# Patient Record
Sex: Female | Born: 1965 | Race: White | Hispanic: No | Marital: Married | Smoking: Never smoker
Health system: Southern US, Community
[De-identification: ages and names within clinical notes are randomized; demographics above are authoritative.]

## PROBLEM LIST (undated history)

## (undated) DIAGNOSIS — J302 Other seasonal allergic rhinitis: Secondary | ICD-10-CM

## (undated) DIAGNOSIS — D279 Benign neoplasm of unspecified ovary: Secondary | ICD-10-CM

---

## 1999-09-18 HISTORY — PX: LAPAROSCOPIC CHOLECYSTECTOMY: SUR755

## 2013-09-17 HISTORY — PX: HEMORRHOID SURGERY: SHX153

## 2014-12-06 ENCOUNTER — Other Ambulatory Visit (HOSPITAL_COMMUNITY)
Admission: RE | Admit: 2014-12-06 | Discharge: 2014-12-06 | Disposition: A | Discharge status: Home / Self Care | Payer: Managed Care, Other (non HMO) | Source: Ambulatory Visit | Attending: Family Medicine | Admitting: Family Medicine

## 2014-12-06 DIAGNOSIS — Z124 Encounter for screening for malignant neoplasm of cervix: Secondary | ICD-10-CM | POA: Diagnosis not present

## 2014-12-06 DIAGNOSIS — Z1151 Encounter for screening for human papillomavirus (HPV): Secondary | ICD-10-CM | POA: Diagnosis present

## 2015-03-29 ENCOUNTER — Other Ambulatory Visit: Payer: Self-pay

## 2015-03-29 DIAGNOSIS — Z1231 Encounter for screening mammogram for malignant neoplasm of breast: Secondary | ICD-10-CM

## 2015-05-04 ENCOUNTER — Ambulatory Visit: Payer: Managed Care, Other (non HMO)

## 2015-05-12 ENCOUNTER — Ambulatory Visit: Payer: Managed Care, Other (non HMO)

## 2015-07-04 ENCOUNTER — Ambulatory Visit
Admission: RE | Admit: 2015-07-04 | Discharge: 2015-07-04 | Disposition: A | Discharge status: Home / Self Care | Payer: Managed Care, Other (non HMO) | Source: Ambulatory Visit

## 2015-07-04 DIAGNOSIS — Z1231 Encounter for screening mammogram for malignant neoplasm of breast: Secondary | ICD-10-CM

## 2015-07-18 ENCOUNTER — Ambulatory Visit: Payer: Managed Care, Other (non HMO)

## 2016-04-20 NOTE — H&P (Signed)
  Patient name Madison Whitaker, Madison Whitaker DICTATION# [id number] CSN# [id number]  Darlyn Chamber, MD 04/20/2016 8:12 AM

## 2016-04-20 NOTE — H&P (Signed)
[name], [name] NO.:  [id number]  MEDICAL RECORD NO.:  [id number]  LOCATION:                                 FACILITY:  PHYSICIAN:  Darlyn Chamber, M.D.   DATE OF BIRTH:  Aug 21, 1966  DATE OF ADMISSION:  05/03/2016 DATE OF DISCHARGE:                             HISTORY & PHYSICAL   The date of her surgery is May 03, 2016.  This is going to be done at [facility] on [facility].  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  In relation to the present admission, the patient was initially seen back in January, complaining of some right lower quadrant discomfort and menstrual irregularity.  We did an ultrasound on her in March that revealed a 4.4 x 3.3 cm cystic mass with calcifications involving the left ovary.  A CA-125 was drawn.  It was negative.  It was felt to be a dermoid.  We __________.  We did a follow-up ultrasound in June, and the cystic area had enlarged down to 4.6 x 3.5 x 3.7.  Again, appearing as a dermoid tumor.  There was no free fluid.  Again, CA-125 was normal.  She is now going to receive a laparoscopic evaluation and possible ovarian cystectomy versus removing the left ovary.  ALLERGIES:  She is allergic to SULFA.  MEDICATIONS:  None.  PAST MEDICAL HISTORY:  Usual childhood diseases.  No significant sequelae.  PAST SURGICAL HISTORY:  She has had her gallbladder removed.  She has had a miscarriage and 2 vaginal deliveries.  SOCIAL HISTORY:  No tobacco and only occasional alcohol use.  FAMILY HISTORY:  Noncontributory.  REVIEW OF SYSTEMS:  Noncontributory.  PHYSICAL EXAMINATION:  VITAL SIGNS:  The patient is afebrile with stable vital signs. HEENT:  The patient is normocephalic.  Pupils are equal, round, and reactive to light and  accommodation.  Extraocular movements were intact. Sclerae and conjunctivae clear.  Oropharynx clear. NECK:  Without thyromegaly. BREASTS:  Not examined. LUNGS:  Clear. CARDIOVASCULAR:  Regular rhythm and rate without murmurs or gallops. There are no carotid or abdominal bruits. ABDOMEN:  Benign.  No mass, organomegaly, or tenderness. PELVIC:  Normal external genitalia.  Vaginal mucosa clear.  Cervix unremarkable.  Uterus normal size, shape, and contour.  Adnexa unremarkable.  Cannot really feel the cyst. EXTREMITIES:  Trace edema. NEUROLOGIC:  Grossly within normal limits.  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  PLAN:  The patient will undergo laparoscopic evaluation and possible removal of cyst or the ovary.  The nature of the procedure have been discussed.  The risks have been explained including risk of infection. Risk of hemorrhage that could require transfusion with the risk of AIDS or hepatitis.  Risk of injury to adjacent organs such as bladder, bowel, or ureters that could require further exploratory surgery.  Risk of deep venous thrombosis and pulmonary embolus.  The patient expressed understanding of potential risks and complications.     Darlyn Chamber,  M.D.   ______________________________ Darlyn Chamber, M.D.    JSM/MEDQ  D:  04/20/2016  T:  04/20/2016  Job:  [id number]

## 2016-04-26 ENCOUNTER — Encounter (HOSPITAL_BASED_OUTPATIENT_CLINIC_OR_DEPARTMENT_OTHER): Payer: Self-pay | Admitting: *Deleted

## 2016-04-27 ENCOUNTER — Encounter (HOSPITAL_BASED_OUTPATIENT_CLINIC_OR_DEPARTMENT_OTHER): Payer: Self-pay | Admitting: *Deleted

## 2016-04-27 NOTE — Progress Notes (Addendum)
NPO AFTER MN.  ARRIVE AT 0600.  GETTING LAB WORK DONE Tuesday 05-01-2016 (CBC, SERUM hCG).

## 2016-05-01 DIAGNOSIS — D271 Benign neoplasm of left ovary: Secondary | ICD-10-CM | POA: Diagnosis not present

## 2016-05-01 DIAGNOSIS — N83202 Unspecified ovarian cyst, left side: Secondary | ICD-10-CM | POA: Diagnosis present

## 2016-05-01 DIAGNOSIS — N8312 Corpus luteum cyst of left ovary: Secondary | ICD-10-CM | POA: Diagnosis not present

## 2016-05-01 DIAGNOSIS — Z79899 Other long term (current) drug therapy: Secondary | ICD-10-CM | POA: Diagnosis not present

## 2016-05-01 LAB — CBC
HEMATOCRIT: 39.6 % (ref 36.0–46.0)
HEMOGLOBIN: 13.5 g/dL (ref 12.0–15.0)
MCH: 32.2 pg (ref 26.0–34.0)
MCHC: 34.1 g/dL (ref 30.0–36.0)
MCV: 94.5 fL (ref 78.0–100.0)
PLATELETS: 254 10*3/uL (ref 150–400)
RBC: 4.19 MIL/uL (ref 3.87–5.11)
RDW: 12.7 % (ref 11.5–15.5)
WBC: 7.4 10*3/uL (ref 4.0–10.5)

## 2016-05-01 LAB — HCG, SERUM, QUALITATIVE: Preg, Serum: NEGATIVE

## 2016-05-03 ENCOUNTER — Encounter (HOSPITAL_BASED_OUTPATIENT_CLINIC_OR_DEPARTMENT_OTHER)
Admission: RE | Disposition: A | Discharge status: Home / Self Care | Payer: Self-pay | Source: Ambulatory Visit | Attending: Obstetrics and Gynecology

## 2016-05-03 ENCOUNTER — Ambulatory Visit (HOSPITAL_BASED_OUTPATIENT_CLINIC_OR_DEPARTMENT_OTHER): Payer: Managed Care, Other (non HMO) | Admitting: Anesthesiology

## 2016-05-03 ENCOUNTER — Encounter (HOSPITAL_BASED_OUTPATIENT_CLINIC_OR_DEPARTMENT_OTHER): Payer: Self-pay | Admitting: *Deleted

## 2016-05-03 ENCOUNTER — Ambulatory Visit (HOSPITAL_BASED_OUTPATIENT_CLINIC_OR_DEPARTMENT_OTHER)
Admission: RE | Admit: 2016-05-03 | Discharge: 2016-05-03 | Disposition: A | Discharge status: Home / Self Care | Payer: Managed Care, Other (non HMO) | Source: Ambulatory Visit | Attending: Obstetrics and Gynecology | Admitting: Obstetrics and Gynecology

## 2016-05-03 DIAGNOSIS — N8312 Corpus luteum cyst of left ovary: Secondary | ICD-10-CM | POA: Insufficient documentation

## 2016-05-03 DIAGNOSIS — D271 Benign neoplasm of left ovary: Secondary | ICD-10-CM | POA: Insufficient documentation

## 2016-05-03 DIAGNOSIS — Z79899 Other long term (current) drug therapy: Secondary | ICD-10-CM | POA: Insufficient documentation

## 2016-05-03 DIAGNOSIS — N83202 Unspecified ovarian cyst, left side: Secondary | ICD-10-CM

## 2016-05-03 HISTORY — DX: Other seasonal allergic rhinitis: J30.2

## 2016-05-03 HISTORY — DX: Benign neoplasm of unspecified ovary: D27.9

## 2016-05-03 HISTORY — PX: LAPAROSCOPY: SHX197

## 2016-05-03 LAB — TYPE AND SCREEN
ABO/RH(D): O NEG
ANTIBODY SCREEN: NEGATIVE

## 2016-05-03 LAB — ABO/RH: ABO/RH(D): O NEG

## 2016-05-03 SURGERY — LAPAROSCOPY, DIAGNOSTIC
Anesthesia: General | Site: Pelvis | Laterality: Left

## 2016-05-03 MED ORDER — FENTANYL CITRATE (PF) 100 MCG/2ML IJ SOLN
INTRAMUSCULAR | Status: AC
  Filled 2016-05-03: qty 2

## 2016-05-03 MED ORDER — STERILE WATER FOR IRRIGATION IR SOLN
Status: DC | PRN
  Administered 2016-05-03: 3000 mL

## 2016-05-03 MED ORDER — OXYCODONE HCL 5 MG PO TABS
5.0000 mg | ORAL_TABLET | Freq: Once | ORAL | Status: DC | PRN
  Filled 2016-05-03: qty 1

## 2016-05-03 MED ORDER — SUGAMMADEX SODIUM 200 MG/2ML IV SOLN
INTRAVENOUS | Status: DC | PRN
  Administered 2016-05-03: 200 mg via INTRAVENOUS

## 2016-05-03 MED ORDER — LIDOCAINE HCL (CARDIAC) 20 MG/ML IV SOLN
INTRAVENOUS | Status: AC
  Filled 2016-05-03: qty 5

## 2016-05-03 MED ORDER — FLUORESCEIN SODIUM 10 % IV SOLN
INTRAVENOUS | Status: AC
  Filled 2016-05-03: qty 5

## 2016-05-03 MED ORDER — FLUORESCEIN SODIUM 10 % IV SOLN
INTRAVENOUS | Status: DC | PRN
  Administered 2016-05-03: 300 mg via INTRAVENOUS

## 2016-05-03 MED ORDER — DEXAMETHASONE SODIUM PHOSPHATE 10 MG/ML IJ SOLN
INTRAMUSCULAR | Status: AC
  Filled 2016-05-03: qty 1

## 2016-05-03 MED ORDER — ROCURONIUM BROMIDE 100 MG/10ML IV SOLN
INTRAVENOUS | Status: DC | PRN
  Administered 2016-05-03: 50 mg via INTRAVENOUS

## 2016-05-03 MED ORDER — ONDANSETRON HCL 4 MG/2ML IJ SOLN
4.0000 mg | Freq: Once | INTRAMUSCULAR | Status: DC | PRN
  Filled 2016-05-03: qty 2

## 2016-05-03 MED ORDER — ONDANSETRON HCL 4 MG/2ML IJ SOLN
INTRAMUSCULAR | Status: DC | PRN
  Administered 2016-05-03: 4 mg via INTRAVENOUS

## 2016-05-03 MED ORDER — FENTANYL CITRATE (PF) 100 MCG/2ML IJ SOLN
INTRAMUSCULAR | Status: DC | PRN
  Administered 2016-05-03 (×3): 50 ug via INTRAVENOUS

## 2016-05-03 MED ORDER — OXYCODONE-ACETAMINOPHEN 7.5-325 MG PO TABS: 1.0000 | ORAL_TABLET | ORAL | 0 refills | Status: AC | PRN

## 2016-05-03 MED ORDER — LIDOCAINE HCL (CARDIAC) 20 MG/ML IV SOLN
INTRAVENOUS | Status: DC | PRN
  Administered 2016-05-03: 80 mg via INTRAVENOUS

## 2016-05-03 MED ORDER — CIPROFLOXACIN IN D5W 400 MG/200ML IV SOLN
400.0000 mg | Freq: Once | INTRAVENOUS | Status: AC
  Administered 2016-05-03: 400 mg via INTRAVENOUS
  Filled 2016-05-03: qty 200

## 2016-05-03 MED ORDER — MEPERIDINE HCL 25 MG/ML IJ SOLN
6.2500 mg | INTRAMUSCULAR | Status: DC | PRN
Start: 2016-05-03 — End: 2016-05-03
  Filled 2016-05-03: qty 1

## 2016-05-03 MED ORDER — OXYCODONE HCL 5 MG/5ML PO SOLN
5.0000 mg | Freq: Once | ORAL | Status: DC | PRN
  Filled 2016-05-03: qty 5

## 2016-05-03 MED ORDER — SODIUM CHLORIDE 0.9 % IR SOLN: Status: DC | PRN

## 2016-05-03 MED ORDER — SODIUM CHLORIDE 0.9 % IJ SOLN
INTRAMUSCULAR | Status: AC
  Filled 2016-05-03: qty 10

## 2016-05-03 MED ORDER — HYDROMORPHONE HCL 1 MG/ML IJ SOLN
0.2500 mg | INTRAMUSCULAR | Status: DC | PRN
  Filled 2016-05-03: qty 1

## 2016-05-03 MED ORDER — SODIUM CHLORIDE 0.9 % IR SOLN
Status: DC | PRN
  Administered 2016-05-03: 500 mL via INTRAVESICAL

## 2016-05-03 MED ORDER — MIDAZOLAM HCL 5 MG/5ML IJ SOLN
INTRAMUSCULAR | Status: DC | PRN
  Administered 2016-05-03: 2 mg via INTRAVENOUS

## 2016-05-03 MED ORDER — LACTATED RINGERS IR SOLN
Status: DC | PRN
  Administered 2016-05-03: 3000 mL

## 2016-05-03 MED ORDER — MIDAZOLAM HCL 2 MG/2ML IJ SOLN
INTRAMUSCULAR | Status: AC
  Filled 2016-05-03: qty 2

## 2016-05-03 MED ORDER — BUPIVACAINE HCL 0.25 % IJ SOLN
INTRAMUSCULAR | Status: DC | PRN
  Administered 2016-05-03: 8 mL

## 2016-05-03 MED ORDER — DEXAMETHASONE SODIUM PHOSPHATE 4 MG/ML IJ SOLN
INTRAMUSCULAR | Status: DC | PRN
  Administered 2016-05-03: 10 mg via INTRAVENOUS

## 2016-05-03 MED ORDER — CIPROFLOXACIN IN D5W 400 MG/200ML IV SOLN
INTRAVENOUS | Status: AC
  Filled 2016-05-03: qty 200

## 2016-05-03 MED ORDER — PROPOFOL 10 MG/ML IV BOLUS
INTRAVENOUS | Status: DC | PRN
  Administered 2016-05-03: 200 mg via INTRAVENOUS

## 2016-05-03 MED ORDER — ARTIFICIAL TEARS OP OINT
TOPICAL_OINTMENT | OPHTHALMIC | Status: AC
  Filled 2016-05-03: qty 3.5

## 2016-05-03 MED ORDER — ROCURONIUM BROMIDE 100 MG/10ML IV SOLN
INTRAVENOUS | Status: AC
  Filled 2016-05-03: qty 1

## 2016-05-03 MED ORDER — ONDANSETRON HCL 4 MG/2ML IJ SOLN
INTRAMUSCULAR | Status: AC
  Filled 2016-05-03: qty 2

## 2016-05-03 MED ORDER — LACTATED RINGERS IV SOLN
INTRAVENOUS | Status: DC
  Administered 2016-05-03 (×2): via INTRAVENOUS
  Filled 2016-05-03: qty 1000

## 2016-05-03 MED ORDER — CEFAZOLIN SODIUM-DEXTROSE 2-4 GM/100ML-% IV SOLN
2.0000 g | INTRAVENOUS | Status: DC
  Filled 2016-05-03: qty 100

## 2016-05-03 SURGICAL SUPPLY — 47 items
APPLICATOR COTTON TIP 6IN STRL (MISCELLANEOUS) ×3 IMPLANT
BAG RETRIEVAL 10 (BASKET) ×1
BAG RETRIEVAL 10MM (BASKET) ×1
BLADE SURG 11 STRL SS (BLADE) ×3 IMPLANT
CANISTER SUCTION 1200CC (MISCELLANEOUS) ×3 IMPLANT
CANISTER SUCTION 2500CC (MISCELLANEOUS) ×3 IMPLANT
CATH ROBINSON RED A/P 16FR (CATHETERS) ×3 IMPLANT
COVER MAYO STAND STRL (DRAPES) ×3 IMPLANT
DRAPE UNDERBUTTOCKS STRL (DRAPE) ×3 IMPLANT
ELECT REM PT RETURN 9FT ADLT (ELECTROSURGICAL) ×3
ELECTRODE REM PT RTRN 9FT ADLT (ELECTROSURGICAL) ×1 IMPLANT
GLOVE BIO SURGEON STRL SZ7 (GLOVE) ×6 IMPLANT
GLOVE ECLIPSE 7.0 STRL STRAW (GLOVE) ×3 IMPLANT
GLOVE INDICATOR 7.0 STRL GRN (GLOVE) ×3 IMPLANT
GLOVE INDICATOR 7.5 STRL GRN (GLOVE) ×12 IMPLANT
GOWN STRL REUS W/TWL LRG LVL3 (GOWN DISPOSABLE) ×6 IMPLANT
GOWN STRL REUS W/TWL XL LVL3 (GOWN DISPOSABLE) ×6 IMPLANT
KIT ROOM TURNOVER WOR (KITS) ×3 IMPLANT
LEGGING LITHOTOMY PAIR STRL (DRAPES) ×3 IMPLANT
LIQUID BAND (GAUZE/BANDAGES/DRESSINGS) ×3 IMPLANT
NEEDLE HYPO 25X1 1.5 SAFETY (NEEDLE) ×3 IMPLANT
NEEDLE INSUFFLATION 14GA 120MM (NEEDLE) ×3 IMPLANT
NS IRRIG 500ML POUR BTL (IV SOLUTION) ×3 IMPLANT
PACK BASIN DAY SURGERY FS (CUSTOM PROCEDURE TRAY) ×3 IMPLANT
PACK LAPAROSCOPY II (CUSTOM PROCEDURE TRAY) ×3 IMPLANT
PAD OB MATERNITY 4.3X12.25 (PERSONAL CARE ITEMS) ×3 IMPLANT
PAD PREP 24X48 CUFFED NSTRL (MISCELLANEOUS) ×3 IMPLANT
POUCH SPECIMEN RETRIEVAL 10MM (ENDOMECHANICALS) IMPLANT
SEALER TISSUE G2 CVD JAW 45CM (ENDOMECHANICALS) ×3 IMPLANT
SET IRRIG TUBING LAPAROSCOPIC (IRRIGATION / IRRIGATOR) ×3 IMPLANT
SET IRRIG Y TYPE TUR BLADDER L (SET/KITS/TRAYS/PACK) ×3 IMPLANT
SOLUTION ANTI FOG 6CC (MISCELLANEOUS) ×3 IMPLANT
SUT VIC AB 3-0 PS2 18 (SUTURE) ×4
SUT VIC AB 3-0 PS2 18XBRD (SUTURE) ×2 IMPLANT
SUT VICRYL 0 UR6 27IN ABS (SUTURE) ×3 IMPLANT
SYR CONTROL 10ML LL (SYRINGE) ×3 IMPLANT
SYRINGE 10CC LL (SYRINGE) ×3 IMPLANT
SYS BAG RETRIEVAL 10MM (BASKET) ×1
SYSTEM BAG RETRIEVAL 10MM (BASKET) ×1 IMPLANT
TOWEL OR 17X24 6PK STRL BLUE (TOWEL DISPOSABLE) ×6 IMPLANT
TRAY DSU PREP LF (CUSTOM PROCEDURE TRAY) ×3 IMPLANT
TROCAR OPTI TIP 5M 100M (ENDOMECHANICALS) ×3 IMPLANT
TROCAR XCEL DIL TIP R 11M (ENDOMECHANICALS) ×3 IMPLANT
TUBING INSUFFLATION 10FT LAP (TUBING) ×3 IMPLANT
WARMER LAPAROSCOPE (MISCELLANEOUS) ×3 IMPLANT
WATER STERILE IRR 3000ML UROMA (IV SOLUTION) ×3 IMPLANT
WATER STERILE IRR 500ML POUR (IV SOLUTION) ×3 IMPLANT

## 2016-05-03 NOTE — Brief Op Note (Signed)
05/03/2016  8:38 AM  PATIENT:  Maud Deed  50 y.o. female  PRE-OPERATIVE DIAGNOSIS:  dermoid of ovary  POST-OPERATIVE DIAGNOSIS:  dermoid of ovary  PROCEDURE:  Procedure(s): LAPAROSCOPY DIAGNOSTIC, ovarian cystectomy,  removal of left tube and ovary, cystoscopy (Left)  SURGEON:  Surgeon(s) and Role:    * Arvella Nigh, MD - Primary  PHYSICIAN ASSISTANT:   ASSISTANTS: none   ANESTHESIA:   local and general  EBL:  Total I/O In: 900 [I.V.:900] Out: 50 [Urine:50]  BLOOD ADMINISTERED:none  DRAINS: none   LOCAL MEDICATIONS USED:  MARCAINE     SPECIMEN:  Source of Specimen:  left tube and ovary  washings  DISPOSITION OF SPECIMEN:  PATHOLOGY  COUNTS:  YES  TOURNIQUET:  * No tourniquets in log *  DICTATION: .Other Dictation: Dictation Number [id number]  PLAN OF CARE: Discharge to home after PACU  PATIENT DISPOSITION:  PACU - hemodynamically stable.   Delay start of Pharmacological VTE agent (>24hrs) due to surgical blood loss or risk of bleeding: not applicable

## 2016-05-03 NOTE — Anesthesia Procedure Notes (Signed)
Procedure Name: Intubation Date/Time: 05/03/2016 7:35 AM Performed by: Wanita Chamberlain Pre-anesthesia Checklist: Patient identified, Timeout performed, Emergency Drugs available, Suction available and Patient being monitored Patient Re-evaluated:Patient Re-evaluated prior to inductionOxygen Delivery Method: Circle system utilized Preoxygenation: Pre-oxygenation with 100% oxygen Intubation Type: IV induction Ventilation: Mask ventilation without difficulty Laryngoscope Size: Mac and 3 Grade View: Grade I Tube type: Oral Tube size: 7.0 mm Number of attempts: 1 Airway Equipment and Method: Stylet Placement Confirmation: ETT inserted through vocal cords under direct vision,  positive ETCO2,  CO2 detector and breath sounds checked- equal and bilateral Secured at: 21 cm Tube secured with: Tape Dental Injury: Teeth and Oropharynx as per pre-operative assessment

## 2016-05-03 NOTE — Anesthesia Postprocedure Evaluation (Signed)
Anesthesia Post Note  Patient: Merelene Thunberg  Procedure(s) Performed: Procedure(s) (LRB): LAPAROSCOPY DIAGNOSTIC, ovarian cystectomy,  removal of left tube and ovary, cystoscopy (Left)  Patient location during evaluation: PACU Anesthesia Type: General Level of consciousness: awake and alert Pain management: pain level controlled Vital Signs Assessment: post-procedure vital signs reviewed and stable Respiratory status: spontaneous breathing, nonlabored ventilation, respiratory function stable and patient connected to nasal cannula oxygen Cardiovascular status: blood pressure returned to baseline and stable Postop Assessment: no signs of nausea or vomiting Anesthetic complications: no    Last Vitals:  Vitals:   05/03/16 0853 05/03/16 0915  BP: 110/67 103/74  Pulse: 72 (!) 50  Resp: (!) 9 11  Temp: 36.4 C     Last Pain:  Vitals:   05/03/16 0915  TempSrc:   PainSc: Asleep                 Gabryel Talamo A

## 2016-05-03 NOTE — H&P (Signed)
  History and physical exam unchanged 

## 2016-05-03 NOTE — Op Note (Signed)
NAMERYA, BISAILLON NO.:  [id number]  MEDICAL RECORD NO.:  [id number]  LOCATION:                                 FACILITY:  PHYSICIAN:  Darlyn Chamber, M.D.        DATE OF BIRTH:  DATE OF PROCEDURE:  05/03/2016 DATE OF DISCHARGE:                              OPERATIVE REPORT   PREOPERATIVE DIAGNOSIS:  Cystic enlargement of left ovary, apparent dermoid.  POSTOPERATIVE DIAGNOSIS:  Cystic enlargement of the left ovary.  OPERATIVE PROCEDURE:  Open laparoscopy with left salpingo-oophorectomy. Cystoscopy.  SURGEON:  Darlyn Chamber, M.D.  ANESTHESIA:  General endotracheal.  ESTIMATED BLOOD LOSS:  Minimal.  PACKS AND DRAINS:  None.  INTRAOPERATIVE BLOOD PLACED:  None.  COMPLICATIONS:  None.  INDICATION:  Dictated in the history and physical.  DESCRIPTION OF PROCEDURE:  The patient was taken to the OR and placed in supine position.  After satisfactory level of general endotracheal anesthesia obtained, the patient was placed in dorsal lithotomy position using the Allen stirrups.  The abdomen, perineum and vagina were prepped out with Betadine.  A Hulka tenaculum was put in place and secured. Bladder was emptied by in-and-out catheterization.  The patient was then draped in sterile field.  A subumbilical incision was made with a knife. Incision was extended through the subcutaneous tissue.  The fascia was identified and entered sharply.  The peritoneum was entered with blunt finger pressure.  The open laparoscopic trocar was put in place and secured.  The abdomen was inflated with carbon dioxide.  Laparoscope was introduced.  There was no evidence of injury to adjacent organs.  The upper abdomen and liver were clear.  Gallbladder was surgically absent. Appendix was normal.  She had cystic enlargement of the left ovary with a 6-7 cm clear cyst.  Did not look like a dermoid.  Right ovary was unremarkable.  A 5-mm trocar was put in place in the suprapubic  area. We then obtained washings.  Again, uterus was normal, right tube and ovary normal.  Left ovary was cystically enlarged.  We then switched the 5-mm trocar for a 10/11 and we put the 5-mm trocar in the left lower quadrant after visualization of the epigastric vessels.  The ovary and tube were elevated.  We could identify the ureter along the left pelvic sidewall.  Using the EnSeal, the ovarian vasculature was cauterized and incised.  The mesenteric attachments of the ovary and tube were cauterized, incised up to the uterus.  Then, the utero-ovarian pedicle was cauterized and incised.  We had some oozing.  This was brought under control with the bipolar.  At this point in time, an endobag was put through the suprapubic incision.  The tube and ovary were placed in an endobag and brought out through the suprapubic area.  We were able to pop the cyst, removed the fluid and the ovary was removed and sent off to Pathology.  We then replaced the 10/11 trocar.  At this point in time bringing the bipolar in, we made sure we had good hemostasis at the utero-ovarian pedicle as well as the ovarian vasculature.  We irrigated the pelvis and there was no  active bleeding noted.  No evidence of injury to adjacent organs.  We deflated the abdomen and reinflated, looked for any bleeding, there was none.  Then, at this point in time, we completely deflated the abdomen.  All trocars were removed. Subumbilical fascia was closed with a figure-of-eight of 0 Vicryl.  Skin was closed with interrupted subcuticular 4-0 Vicryl.  Suprapubic incision was closed with interrupted sutures of 3-0 Vicryl.  The left lower quadrant incision was closed with Dermabond.  Hulka tenaculum was then removed.  The patient was given fluorescein.  Cystoscopy was performed.  There was no evidence of injury to the bladder.  We could see green-tinged urine coming from the left ureter indicating that was intact.  Cystoscope was then  removed.  The bladder was emptied.  The patient was taken out of the dorsal lithotomy position.  Once alert and extubated, transferred to the recovery room in good condition.  Sponge, instrument and needle counts were reported as correct by circulating nurse x2.     Darlyn Chamber, M.D.     JSM/MEDQ  D:  05/03/2016  T:  05/03/2016  Job:  [id number]

## 2016-05-03 NOTE — Discharge Instructions (Signed)
Laparoscopy- Care After Refer to this sheet in the next few weeks. These instructions provide you with information about caring for yourself after your procedure. Your health care provider may also give you more specific instructions. Your treatment has been planned according to current medical practices, but problems sometimes occur. Call your health care provider if you have any problems or questions after your procedure. WHAT TO EXPECT AFTER THE PROCEDURE After your procedure, it is common to have:  Sore throat.  Soreness at the incision site.  Mild cramping.  Tiredness.  Mild nausea or vomiting.  Shoulder pain. HOME CARE INSTRUCTIONS  Rest for the remainder of the day.  Take medicines only as directed by your health care provider. These include over-the-counter medicines and prescription medicines. Do not take aspirin, which can cause bleeding.  Over the next few days, gradually return to your normal activities and your normal diet.  Avoid sexual intercourse for 2 weeks or as directed by your health care provider.  Do not use tampons, and do not douche.  Do not drive or operate heavy machinery while taking pain medicine.  Do not lift anything that is heavier than 5 lb (2.3 kg) for 2 weeks or as directed by your health care provider.  Do not take baths. Take showers only. Ask your health care provider when you can start taking baths.  Try to have help for your household needs for the first 7-10 days.  There are many different ways to close and cover an incision, including stitches (sutures), skin glue, and adhesive strips. Follow instructions from your health care provider about:  Incision care.  Bandage (dressing) changes and removal.  Incision closure removal.  Check your incision area every day for signs of infection. Watch for:  Redness, swelling, or pain.  Fluid, blood, or pus.  Keep all follow-up visits as directed by your health care provider. SEEK MEDICAL  CARE IF:  You have redness, swelling, or increasing pain in your incision area.  You have fluid, blood, or pus coming from your incision for longer than 1 day.  You notice a bad smell coming from your incision or your dressing.  The edges of your incision break open after the sutures have been removed.  Your pain does not decrease after 2-3 days.  You have a rash.  You repeatedly become dizzy or light-headed.  You have a reaction to your medicine.  Your pain medicine is not helping.  You are constipated. SEEK IMMEDIATE MEDICAL CARE IF:  You have a fever.  You faint.  You have increasing pain in your abdomen.  You have severe pain in one or both of your shoulders.  You have bleeding or drainage from your suture sites or your vagina after surgery.  You have shortness of breath or have difficulty breathing.  You have chest pain or leg pain.  You have ongoing nausea, vomiting, or diarrhea.   This information is not intended to replace advice given to you by your health care provider. Make sure you discuss any questions you have with your health care provider.   Document Released: 03/23/2005 Document Revised: 01/18/2015 Document Reviewed: 12/15/2011 Elsevier Interactive Patient Education 2016 [facility] Home Care Instructions  Activity: Get plenty of rest for the remainder of the day. A responsible adult should stay with you for 24 hours following the procedure.  For the next 24 hours, DO NOT: -Drive a car -Paediatric nurse -Drink alcoholic beverages -Take any medication unless instructed by your  physician -Make any legal decisions or sign important papers.  Meals: Start with liquid foods such as gelatin or soup. Progress to regular foods as tolerated. Avoid greasy, spicy, heavy foods. If nausea and/or vomiting occur, drink only clear liquids until the nausea and/or vomiting subsides. Call your physician if vomiting continues.  Special  Instructions/Symptoms: Your throat may feel dry or sore from the anesthesia or the breathing tube placed in your throat during surgery. If this causes discomfort, gargle with warm salt water. The discomfort should disappear within 24 hours.  If you had a scopolamine patch placed behind your ear for the management of post- operative nausea and/or vomiting:  1. The medication in the patch is effective for 72 hours, after which it should be removed.  Wrap patch in a tissue and discard in the trash. Wash hands thoroughly with soap and water. 2. You may remove the patch earlier than 72 hours if you experience unpleasant side effects which may include dry mouth, dizziness or visual disturbances. 3. Avoid touching the patch. Wash your hands with soap and water after contact with the patch.

## 2016-05-03 NOTE — Transfer of Care (Signed)
Immediate Anesthesia Transfer of Care Note  Patient: Madison Whitaker  Procedure(s) Performed: Procedure(s): LAPAROSCOPY DIAGNOSTIC, ovarian cystectomy,  removal of left tube and ovary, cystoscopy (Left)  Patient Location: PACU  Anesthesia Type:General  Level of Consciousness: awake, alert , oriented and patient cooperative  Airway & Oxygen Therapy: Patient Spontanous Breathing and Patient connected to nasal cannula oxygen  Post-op Assessment: Report given to RN and Post -op Vital signs reviewed and stable  Post vital signs: Reviewed and stable  Last Vitals:  Vitals:   05/03/16 0915 05/03/16 0930  BP: 103/74 100/70  Pulse: (!) 50 (!) 48  Resp: 11 11  Temp:      Last Pain:  Vitals:   05/03/16 0930  TempSrc:   PainSc: 4       Patients Stated Pain Goal: 6 ([id number])  Complications: No apparent anesthesia complications

## 2016-05-03 NOTE — Anesthesia Preprocedure Evaluation (Addendum)
Anesthesia Evaluation  Patient identified by MRN, date of birth, ID band Patient awake    Reviewed: Allergy & Precautions, NPO status , Patient's Chart, lab work & pertinent test results  Airway Mallampati: I  TM Distance: >3 FB Neck ROM: Full    Dental  (+) Teeth Intact, Dental Advisory Given   Pulmonary neg pulmonary ROS,    breath sounds clear to auscultation       Cardiovascular Exercise Tolerance: Good  Rhythm:Regular Rate:Normal     Neuro/Psych negative psych ROS   GI/Hepatic negative GI ROS, Neg liver ROS,   Endo/Other  negative endocrine ROS  Renal/GU negative Renal ROS  negative genitourinary   Musculoskeletal negative musculoskeletal ROS (+)   Abdominal   Peds  Hematology negative hematology ROS (+)   Anesthesia Other Findings   Reproductive/Obstetrics negative OB ROS                           Anesthesia Physical Anesthesia Plan  ASA: I  Anesthesia Plan: General   Post-op Pain Management:    Induction: Intravenous  Airway Management Planned: Oral ETT  Additional Equipment:   Intra-op Plan:   Post-operative Plan: Extubation in OR  Informed Consent: I have reviewed the patients History and Physical, chart, labs and discussed the procedure including the risks, benefits and alternatives for the proposed anesthesia with the patient or authorized representative who has indicated his/her understanding and acceptance.   Dental advisory given  Plan Discussed with: CRNA, Anesthesiologist and Surgeon  Anesthesia Plan Comments:         Anesthesia Quick Evaluation

## 2016-05-04 ENCOUNTER — Encounter (HOSPITAL_BASED_OUTPATIENT_CLINIC_OR_DEPARTMENT_OTHER): Payer: Self-pay | Admitting: Obstetrics and Gynecology

## 2016-06-15 ENCOUNTER — Other Ambulatory Visit: Payer: Self-pay | Admitting: Family Medicine

## 2016-06-15 DIAGNOSIS — Z1231 Encounter for screening mammogram for malignant neoplasm of breast: Secondary | ICD-10-CM

## 2016-07-05 ENCOUNTER — Ambulatory Visit
Admission: RE | Admit: 2016-07-05 | Discharge: 2016-07-05 | Disposition: A | Discharge status: Home / Self Care | Payer: Managed Care, Other (non HMO) | Source: Ambulatory Visit | Attending: Family Medicine | Admitting: Family Medicine

## 2016-07-05 DIAGNOSIS — Z1231 Encounter for screening mammogram for malignant neoplasm of breast: Secondary | ICD-10-CM

## 2018-06-27 ENCOUNTER — Other Ambulatory Visit: Payer: Self-pay | Admitting: Family Medicine

## 2018-06-27 DIAGNOSIS — Z1231 Encounter for screening mammogram for malignant neoplasm of breast: Secondary | ICD-10-CM

## 2018-07-08 ENCOUNTER — Ambulatory Visit
Admission: RE | Admit: 2018-07-08 | Discharge: 2018-07-08 | Disposition: A | Discharge status: Home / Self Care | Payer: Commercial Managed Care - PPO | Source: Ambulatory Visit | Attending: Family Medicine | Admitting: Family Medicine

## 2018-07-08 DIAGNOSIS — Z1231 Encounter for screening mammogram for malignant neoplasm of breast: Secondary | ICD-10-CM

## 2020-01-14 ENCOUNTER — Ambulatory Visit: Payer: Self-pay | Attending: Internal Medicine

## 2020-01-14 ENCOUNTER — Ambulatory Visit: Payer: Commercial Managed Care - PPO

## 2020-01-14 DIAGNOSIS — Z23 Encounter for immunization: Secondary | ICD-10-CM

## 2020-01-14 NOTE — Progress Notes (Signed)
   Covid-19 Vaccination Clinic  Name:  Madison Whitaker    MRN: [id number] DOB: [birth year]  01/14/2020  Madison Whitaker was observed post Covid-19 immunization for 15 minutes without incident. She was provided with Vaccine Information Sheet and instruction to access the V-Safe system.   Madison Whitaker was instructed to call 911 with any severe reactions post vaccine: Marland Kitchen Difficulty breathing  . Swelling of face and throat  . A fast heartbeat  . A bad rash all over body  . Dizziness and weakness   Immunizations Administered    Name Date Dose VIS Date Route   Pfizer COVID-19 Vaccine 01/14/2020  2:23 PM 0.3 mL 11/11/2018 Intramuscular   Manufacturer: [place]   Lot: [id number]   [place]: [id number]

## 2020-02-08 ENCOUNTER — Ambulatory Visit: Payer: Self-pay | Attending: Internal Medicine

## 2020-02-08 DIAGNOSIS — Z23 Encounter for immunization: Secondary | ICD-10-CM

## 2020-02-08 NOTE — Progress Notes (Signed)
   Covid-19 Vaccination Clinic  Name:  Madison Whitaker    MRN: [id number] DOB: [birth year]  02/08/2020  [name] was observed post Covid-19 immunization for 15 minutes without incident. She was provided with Vaccine Information Sheet and instruction to access the V-Safe system.   [name] was instructed to call 911 with any severe reactions post vaccine: Marland Kitchen Difficulty breathing  . Swelling of face and throat  . A fast heartbeat  . A bad rash all over body  . Dizziness and weakness   Immunizations Administered    Name Date Dose VIS Date Route   Pfizer COVID-19 Vaccine 02/08/2020 10:09 AM 0.3 mL 11/11/2018 Intramuscular   Manufacturer: [employer]   Lot: [id number]   [place]: [id number]

## 2020-09-28 IMAGING — MG DIGITAL SCREENING BILATERAL MAMMOGRAM WITH CAD
4 series · 4 of 4 positions shown · non-contrast
Comparison: Previous exam(s).

CLINICAL DATA: Screening.

EXAM:
DIGITAL SCREENING BILATERAL MAMMOGRAM WITH CAD

[R CC]
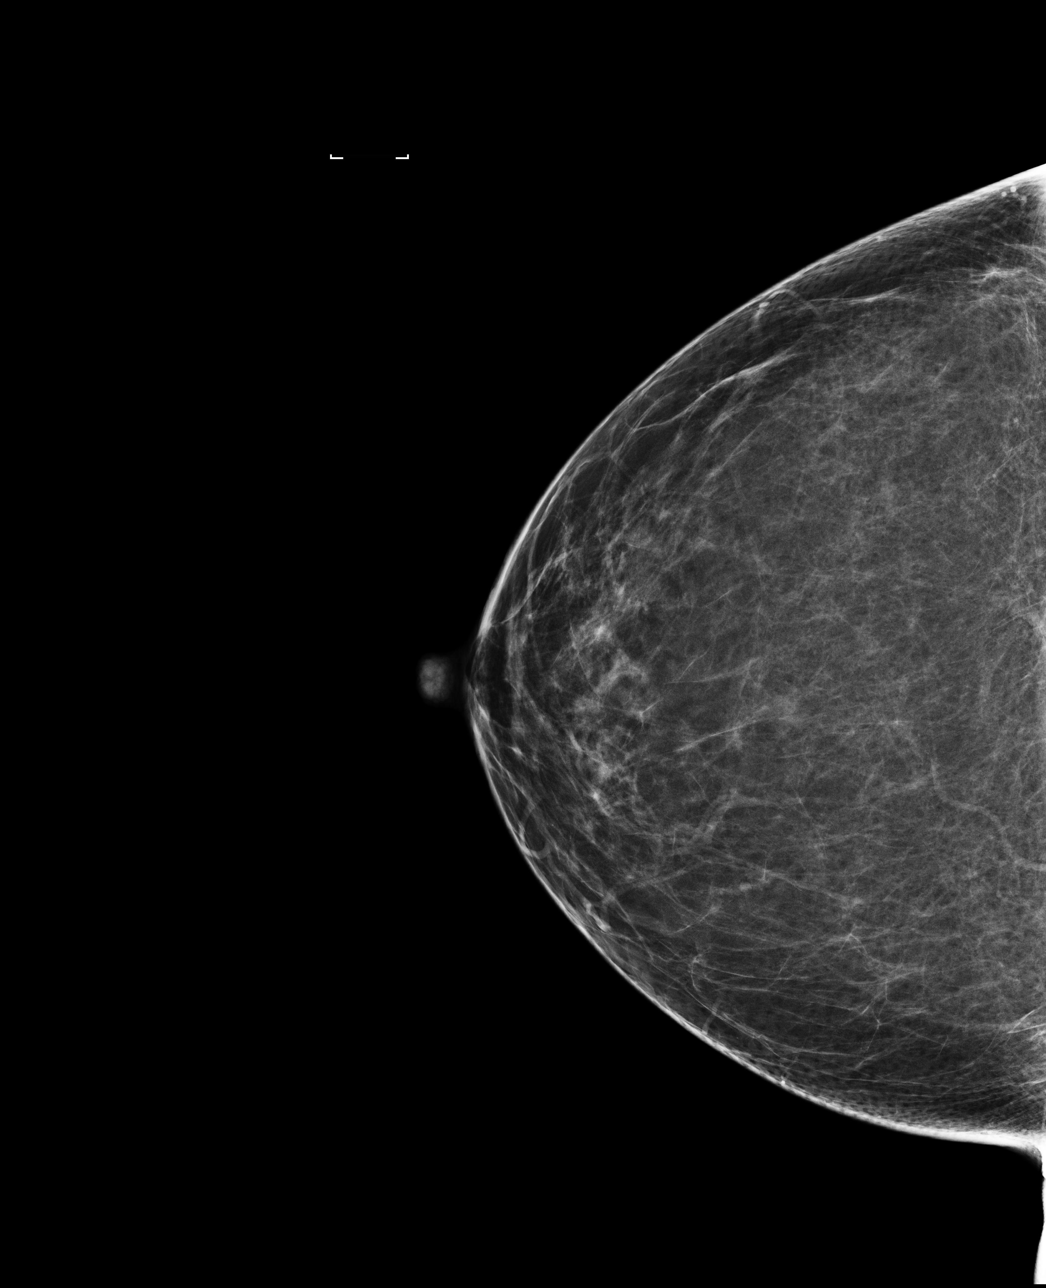

[R MLO]
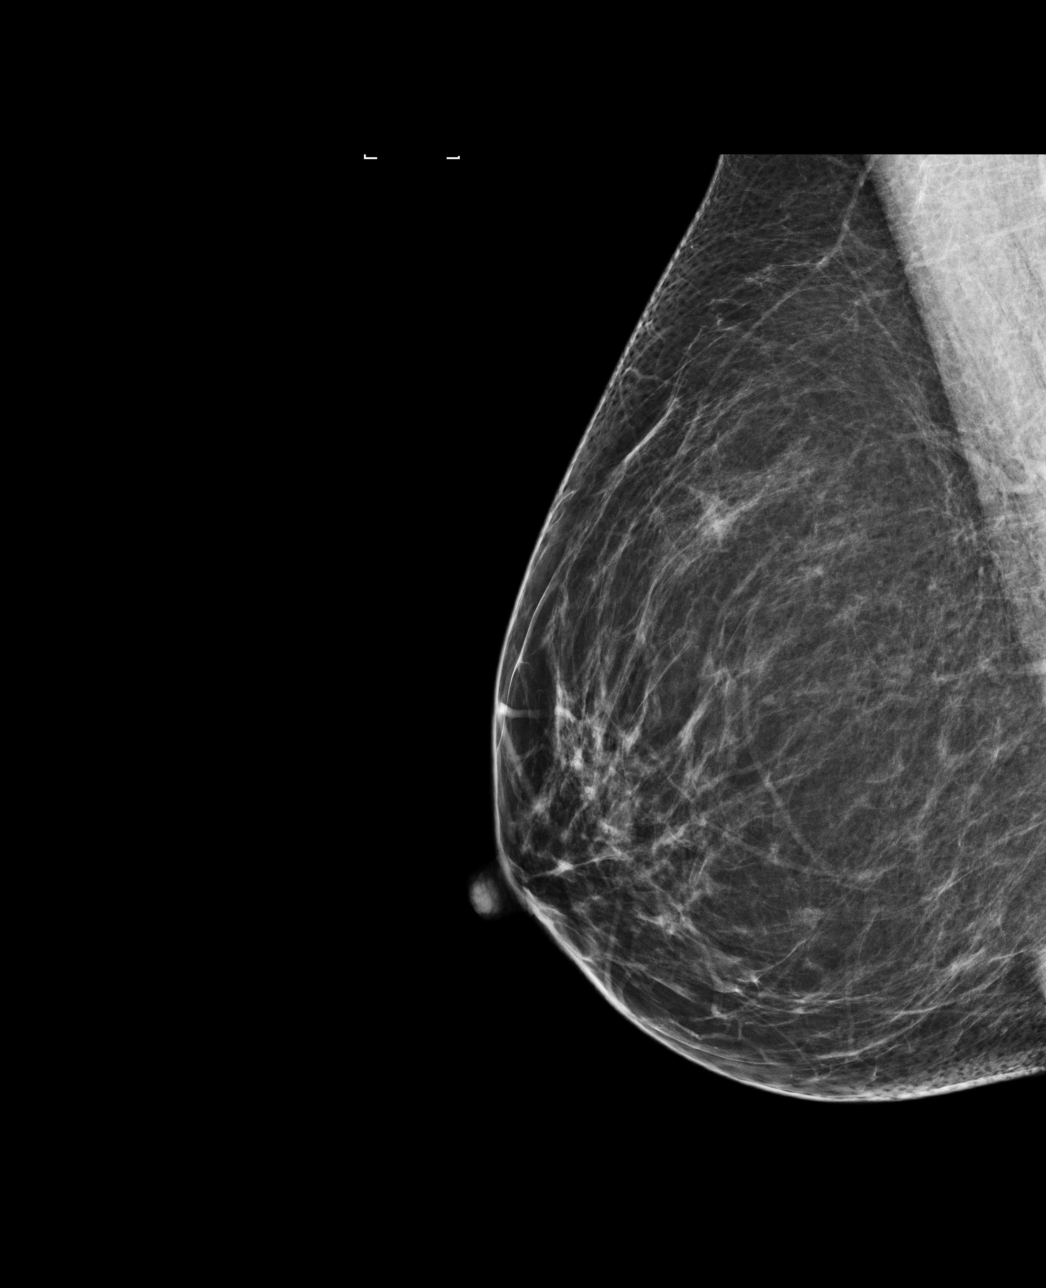

[L MLO]
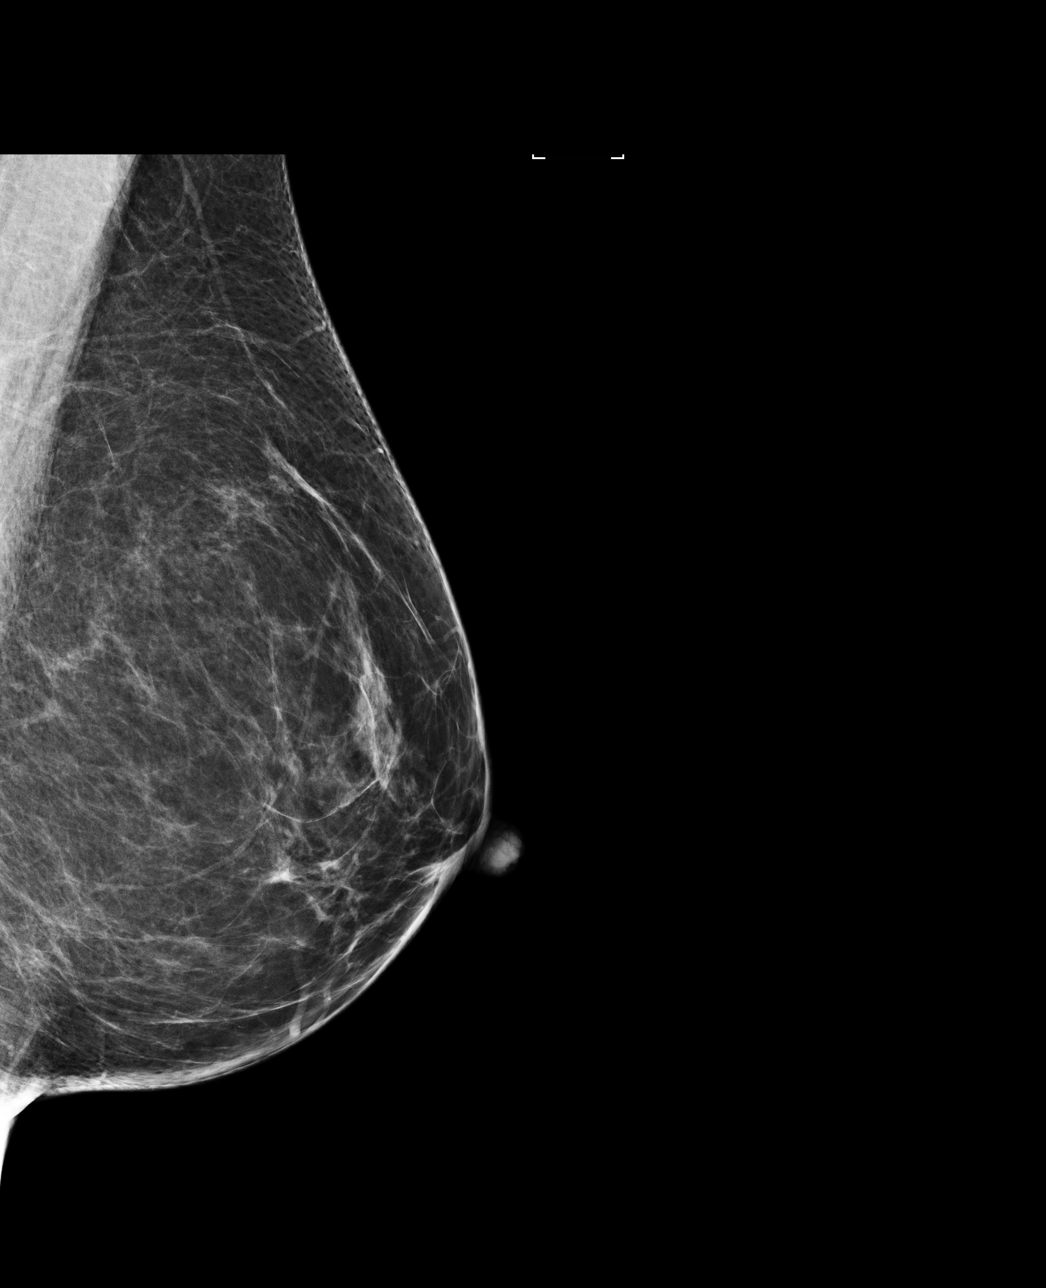

[L CC]
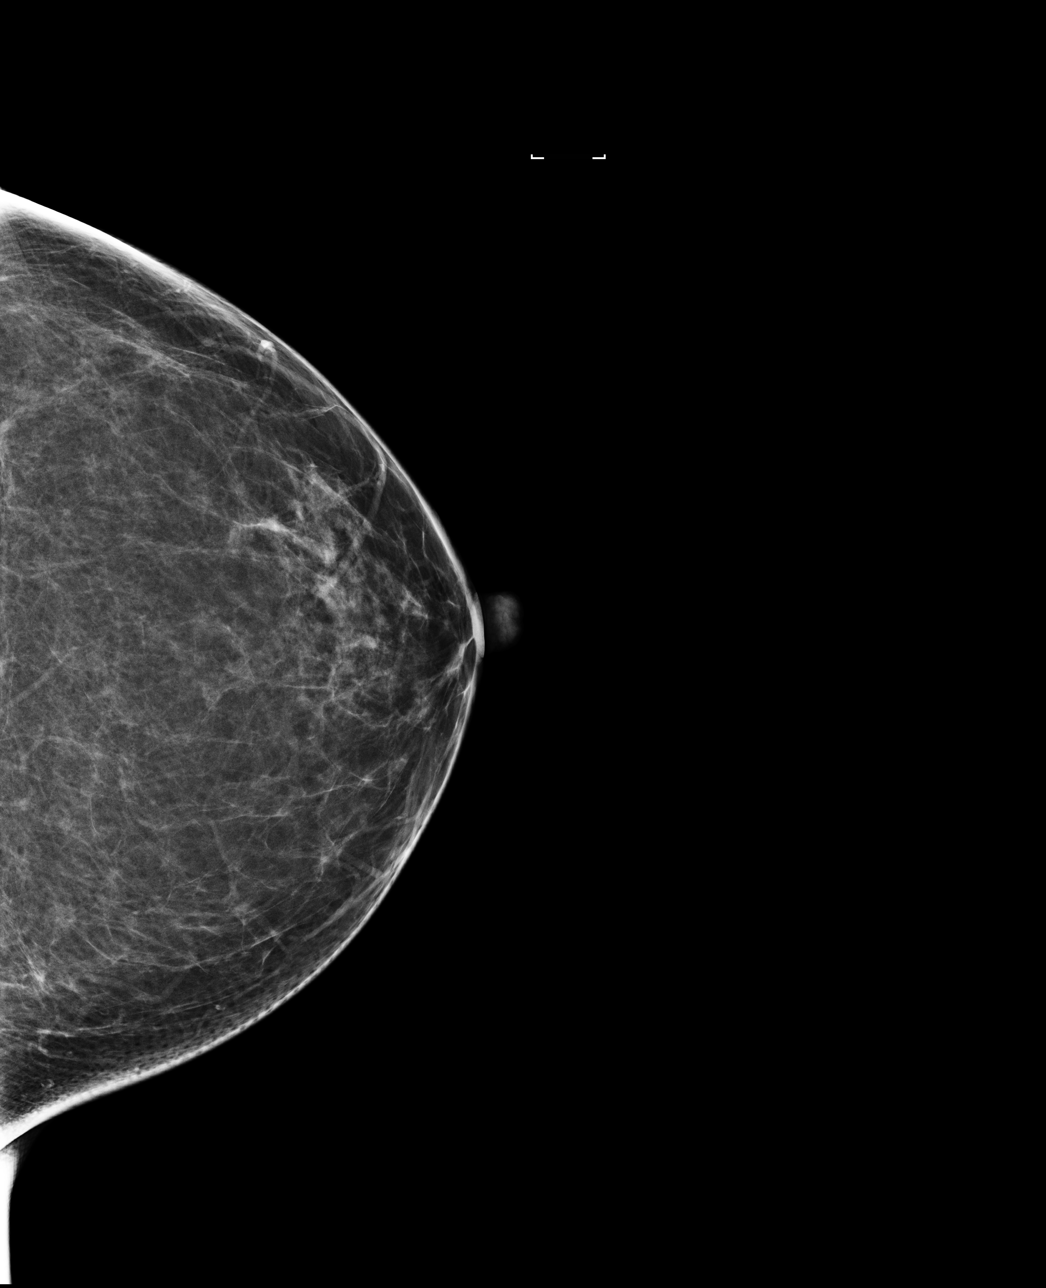

[4 of 4 positions shown; findings below may reference images not displayed]

ACR Breast Density Category b: There are scattered areas of
fibroglandular density.
FINDINGS: There are no findings suspicious for malignancy. Images were
processed with CAD.
IMPRESSION: No mammographic evidence of malignancy. A result letter of this
screening mammogram will be mailed directly to the patient.

RECOMMENDATION:
Screening mammogram in one year. (Code:AS-G-LCT)

BI-RADS CATEGORY  1: Negative.
